# Patient Record
Sex: Female | Born: 1945 | Race: White | Hispanic: No | Marital: Married | Smoking: Never smoker
Health system: Southern US, Community
[De-identification: ages and names within clinical notes are randomized; demographics above are authoritative.]

## PROBLEM LIST (undated history)

## (undated) DIAGNOSIS — L509 Urticaria, unspecified: Secondary | ICD-10-CM

## (undated) DIAGNOSIS — I10 Essential (primary) hypertension: Secondary | ICD-10-CM

## (undated) HISTORY — PX: REPLACEMENT TOTAL KNEE: SUR1224

## (undated) HISTORY — PX: KNEE ARTHROSCOPY W/ MENISCAL REPAIR: SHX1877

## (undated) HISTORY — PX: FOOT SURGERY: SHX648

## (undated) HISTORY — DX: Urticaria, unspecified: L50.9

## (undated) HISTORY — PX: OTHER SURGICAL HISTORY: SHX169

## (undated) NOTE — Progress Notes (Signed)
 Formatting of this note is different from the original.   Subjective:  Subjective Patient ID: Laurie Barker is a 82 y.o. female.  Laurie Barker is here this evening with c/o sore throat and persistent cough Is [place] and developed symptoms 2 days ago Denies fever but has felt chilled Decreased appetite and energy Would like strep testing done No known exposure Has not taken anything for current symptoms Cough keeping her up at night  Patient's problem list, medications, allergies, past medical, surgical, social and family histories were reviewed and updated as appropriate.  Review of Systems  Constitutional: Positive for appetite change, chills and fatigue. Negative for fever.  HENT: Positive for sore throat. Negative for congestion.   Respiratory: Positive for cough. Negative for chest tightness and shortness of breath.     Objective:  Objective BP 112/80   Pulse 65   Temp 36.1 C (96.9 F) (Temporal)   Resp 16   Ht 154.9 cm (5' 1)   Wt 77.8 kg (171 lb 9.6 oz)   SpO2 96%   BMI 32.42 kg/m  Body mass index is 32.42 kg/m.  Physical Exam Vitals and nursing note reviewed.  Constitutional:      General: She is not in acute distress.    Appearance: She is well-developed. She is ill-appearing. She is not toxic-appearing or diaphoretic.  HENT:     Mouth/Throat:     Mouth: Mucous membranes are moist.     Pharynx: Uvula midline. Pharyngeal swelling and posterior oropharyngeal erythema present. No oropharyngeal exudate.     Tonsils: No tonsillar abscesses.  Cardiovascular:     Rate and Rhythm: Normal rate.     Heart sounds: Normal heart sounds.  Pulmonary:     Effort: Pulmonary effort is normal. No respiratory distress.     Breath sounds: Normal breath sounds.  Lymphadenopathy:     Cervical: Cervical adenopathy present.  Skin:    General: Skin is warm and dry.  Neurological:     General: No focal deficit present.     Mental Status: She is alert.  Psychiatric:         Mood and Affect: Mood normal.        Behavior: Behavior normal.     Assessment/Orders:  Assessment Diagnoses and all orders for this visit:  Strep pharyngitis -     Discontinue: azithromycin (ZITHROMAX) 500 MG tablet; Take 1 (one) tablet by mouth daily for 5 days. -     Discontinue: predniSONE (DELTASONE) 20 MG tablet; Take 2 (two) tablets by mouth daily for 5 days. -     azithromycin (ZITHROMAX) 500 MG tablet; Take 1 (one) tablet by mouth daily for 5 days. -     predniSONE (DELTASONE) 20 MG tablet; Take 2 (two) tablets by mouth daily for 5 days.  Sore throat -     AMB POCT Rapid Strep A  Acute bronchitis, unspecified organism    Results for orders placed or performed in visit on 03/26/22 (from the past 24 hour(s))  AMB POCT Rapid Strep A   Collection Time: 03/26/22 12:00 AM  Result Value Ref Range   Rapid Strep A Screen Positive (A) Negative   No images are attached to the encounter or orders placed in the encounter.  Plan:  Plan   Advised to return to clinic or primary care clinic if problems persist or worsen.    Patient instructed to take the full length of prescribed medications, to not share any foods/utensils/drinks with anyone else, to throw  away current toothbrush and use a new one, good hand hygiene, gargle with salt water (mix 1/2 tsp salt with 8 oz warm water gargles every 6 hours as needed to soothe throat), drink plenty of liquids, eat soft/bland foods/popsicles until other foods are tolerable, only return to work/daycare/school after 24 hours of antibiotic use, and get plenty of rest.   Electronically signed by Franchot Shellia LABOR, ARNP at 03/26/2022  5:41 PM CDT

---

## 2017-07-01 ENCOUNTER — Encounter (HOSPITAL_BASED_OUTPATIENT_CLINIC_OR_DEPARTMENT_OTHER): Payer: Self-pay

## 2017-07-01 ENCOUNTER — Emergency Department (HOSPITAL_BASED_OUTPATIENT_CLINIC_OR_DEPARTMENT_OTHER): Payer: Medicare Other

## 2017-07-01 ENCOUNTER — Emergency Department (HOSPITAL_BASED_OUTPATIENT_CLINIC_OR_DEPARTMENT_OTHER)
Admission: EM | Admit: 2017-07-01 | Discharge: 2017-07-01 | Disposition: A | Discharge status: Home / Self Care | Payer: Medicare Other | Attending: Emergency Medicine | Admitting: Emergency Medicine

## 2017-07-01 ENCOUNTER — Other Ambulatory Visit: Payer: Self-pay

## 2017-07-01 DIAGNOSIS — Y999 Unspecified external cause status: Secondary | ICD-10-CM | POA: Diagnosis not present

## 2017-07-01 DIAGNOSIS — Y929 Unspecified place or not applicable: Secondary | ICD-10-CM | POA: Diagnosis not present

## 2017-07-01 DIAGNOSIS — I10 Essential (primary) hypertension: Secondary | ICD-10-CM | POA: Insufficient documentation

## 2017-07-01 DIAGNOSIS — S59911A Unspecified injury of right forearm, initial encounter: Secondary | ICD-10-CM | POA: Diagnosis present

## 2017-07-01 DIAGNOSIS — Z79899 Other long term (current) drug therapy: Secondary | ICD-10-CM | POA: Diagnosis not present

## 2017-07-01 DIAGNOSIS — T148XXA Other injury of unspecified body region, initial encounter: Secondary | ICD-10-CM

## 2017-07-01 DIAGNOSIS — Z91040 Latex allergy status: Secondary | ICD-10-CM | POA: Diagnosis not present

## 2017-07-01 DIAGNOSIS — Y939 Activity, unspecified: Secondary | ICD-10-CM | POA: Diagnosis not present

## 2017-07-01 DIAGNOSIS — X58XXXA Exposure to other specified factors, initial encounter: Secondary | ICD-10-CM | POA: Diagnosis not present

## 2017-07-01 DIAGNOSIS — S46911A Strain of unspecified muscle, fascia and tendon at shoulder and upper arm level, right arm, initial encounter: Secondary | ICD-10-CM | POA: Insufficient documentation

## 2017-07-01 HISTORY — DX: Essential (primary) hypertension: I10

## 2017-07-01 MED ORDER — IBUPROFEN 800 MG PO TABS
ORAL_TABLET | ORAL | Status: AC
  Filled 2017-07-01: qty 1

## 2017-07-01 MED ORDER — IBUPROFEN 800 MG PO TABS
800.0000 mg | ORAL_TABLET | Freq: Once | ORAL | Status: AC
  Administered 2017-07-01: 800 mg via ORAL
  Filled 2017-07-01: qty 1

## 2017-07-01 MED ORDER — IBUPROFEN 400 MG PO TABS: 400.0000 mg | ORAL_TABLET | Freq: Three times a day (TID) | ORAL | 0 refills | Status: DC | PRN

## 2017-07-01 MED ORDER — ACETAMINOPHEN 500 MG PO TABS
1000.0000 mg | ORAL_TABLET | Freq: Once | ORAL | Status: AC
  Administered 2017-07-01: 1000 mg via ORAL
  Filled 2017-07-01: qty 2
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
## 2017-07-03 ENCOUNTER — Encounter (HOSPITAL_BASED_OUTPATIENT_CLINIC_OR_DEPARTMENT_OTHER): Payer: Self-pay | Admitting: Emergency Medicine

## 2017-08-05 ENCOUNTER — Ambulatory Visit (INDEPENDENT_AMBULATORY_CARE_PROVIDER_SITE_OTHER): Payer: Medicare Other | Admitting: Allergy and Immunology

## 2017-08-05 ENCOUNTER — Encounter: Payer: Self-pay | Admitting: Allergy and Immunology

## 2017-08-05 VITALS — BP 140/90 | HR 70 | Temp 97.9°F | Resp 16 | Ht 60.5 in | Wt 169.6 lb

## 2017-08-05 DIAGNOSIS — K2970 Gastritis, unspecified, without bleeding: Secondary | ICD-10-CM

## 2017-08-05 DIAGNOSIS — L500 Allergic urticaria: Secondary | ICD-10-CM | POA: Diagnosis not present

## 2017-08-05 DIAGNOSIS — T7840XD Allergy, unspecified, subsequent encounter: Secondary | ICD-10-CM

## 2017-08-05 DIAGNOSIS — T7840XA Allergy, unspecified, initial encounter: Secondary | ICD-10-CM | POA: Insufficient documentation

## 2017-08-05 MED ORDER — RANITIDINE HCL 150 MG PO TABS: 150.0000 mg | ORAL_TABLET | Freq: Two times a day (BID) | ORAL | 5 refills | Status: AC

## 2017-08-05 MED ORDER — FEXOFENADINE HCL 180 MG PO TABS: 180.0000 mg | ORAL_TABLET | Freq: Every day | ORAL | 5 refills | Status: AC | PRN

## 2017-08-05 NOTE — Patient Instructions (Addendum)
Recurrent urticaria Unclear etiology.  The history suggests that the onset of the mast cell release was triggered by a viral or bacterial infection.  However, the persistence of the urticaria over the past 7 or 8 months warrants further investigation.  Skin tests to select food allergens were negative today. NSAIDs and emotional stress commonly exacerbate urticaria but are not the underlying etiology in this case. Physical urticarias are negative by history (i.e. pressure-induced, temperature, vibration, solar, etc.). There are no concomitant symptoms concerning for anaphylaxis. We will rule out other potential etiologies with labs. For symptom relief, patient is to take oral antihistamines as directed.  The following labs have been ordered: FCeRI antibody, TSH, anti-thyroglobulin antibody, thyroid peroxidase antibody, tryptase, urea breath test, CBC, CMP, ESR, ANA, and galactose-alpha-1,3-galactose IgE level.  The patient will be called with further recommendations after lab results have returned.  Instructions have been discussed and provided for H1/H2 receptor blockade with titration to find lowest effective dose.  A prescription has been provided for fexofenadine 180 mg daily as needed.  A prescription has been provided for ranitidine 150 mg twice daily.  Should there be a significant increase or change in symptoms, a journal is to be kept recording any foods eaten, beverages consumed, medications taken within a 6 hour period prior to the onset of symptoms, as well as record activities being performed, and environmental conditions. For any symptoms concerning for anaphylaxis, 911 is to be called immediately.   When lab results have returned the patient will be called with further recommendations and follow up instructions.   Urticaria (Hives)  . Fexofenadine (Allegra) 180 mg once a day.  If symptoms continue then increase to .  Marland Kitchen Fexofenadine (Allegra) 180 mg  twice a day.  If symptoms  continue then increase to .  Marland Kitchen Fexofenadine (Allegra) 180 mg  twice a day and Ranitidine (Zantac) 150 mg once a day.  If symptoms continue then increase to.  Marland Kitchen Fexofenadine (Allegra) 180 mg  twice a day and Ranitidine (Zantac) 150 mg twice a day  May use hydroxyzine or Benadryl as needed for breakthrough symptoms       If no symptoms for 7 days, then step down dosage

## 2017-08-05 NOTE — Progress Notes (Signed)
New Patient Note  RE: Laurie Barker MRN: [id number] DOB: [birth year] Date of Office Visit: 08/05/2017  Referring provider: Keane Scrape* Primary care provider: Jonathon Bellows, PA-C  Chief Complaint: Allergic Reaction and Urticaria   History of present illness: Laurie Barker is a 71 y.o. female seen today in consultation requested by Keane Scrape, PA-C.  Over the past 7 months, [name] has experienced recurrent episodes of hives.  The first episode occurred in May.  Apparently, she went to bed one night febrile with chills and woke up the next morning "covered in hives."  The hives have appeared at different times over her entire body.  The lesions are described as erythematous, raised, and pruritic.  Individual hives last less than 24 hours without leaving residual pigmentation or bruising. She denies concomitant angioedema, cardiopulmonary symptoms and GI symptoms. She has not experienced unexpected weight loss, recurrent fevers or drenching night sweats. No specific medication, food, skin care product, detergent, soap, or other environmental triggers have been identified.  She started losartan several months ago, however this medication was initiated after the onset of urticaria.  The symptoms do not seem to correlate with NSAIDs use or emotional stress. Darah attempts to control the hives with topical betamethasone valerate and hydroxyzine at bedtime with inadequate relief.  She has received 2 corticosteroid injections with temporary suppression of hives.  She has not been evaluated and treated in the emergency department for these symptoms. She has been evaluated by a dermatologist and skin biopsy has been performed, though with inconclusive results. Recently, she has been experiencing hives daily.   Assessment and plan: Recurrent urticaria Unclear etiology.  The history suggests that the onset of the mast cell release was triggered by a viral or  bacterial infection.  However, the persistence of the urticaria over the past 7 or 8 months warrants further investigation.  Skin tests to select food allergens were negative today. NSAIDs and emotional stress commonly exacerbate urticaria but are not the underlying etiology in this case. Physical urticarias are negative by history (i.e. pressure-induced, temperature, vibration, solar, etc.). There are no concomitant symptoms concerning for anaphylaxis. We will rule out other potential etiologies with labs. For symptom relief, patient is to take oral antihistamines as directed.  The following labs have been ordered: FCeRI antibody, TSH, anti-thyroglobulin antibody, thyroid peroxidase antibody, tryptase, urea breath test, CBC, CMP, ESR, ANA, and galactose-alpha-1,3-galactose IgE level.  The patient will be called with further recommendations after lab results have returned.  Instructions have been discussed and provided for H1/H2 receptor blockade with titration to find lowest effective dose.  A prescription has been provided for fexofenadine 180 mg daily as needed.  A prescription has been provided for ranitidine 150 mg twice daily.  Should there be a significant increase or change in symptoms, a journal is to be kept recording any foods eaten, beverages consumed, medications taken within a 6 hour period prior to the onset of symptoms, as well as record activities being performed, and environmental conditions. For any symptoms concerning for anaphylaxis, 911 is to be called immediately.   Meds ordered this encounter  Medications  . ranitidine (ZANTAC) 150 MG tablet    Sig: Take 1 tablet (150 mg total) by mouth 2 (two) times daily.    Dispense:  60 tablet    Refill:  5  . fexofenadine (ALLEGRA) 180 MG tablet    Sig: Take 1 tablet (180 mg total) by mouth daily as needed for allergies or rhinitis.  Dispense:  30 tablet    Refill:  5    Diagnostics: Environmental skin testing: Negative  despite a positive histamine control. Food allergen skin testing: Negative despite a positive histamine control.    Physical examination: Blood pressure 140/90, pulse 70, temperature 97.9 F (36.6 C), temperature source Oral, resp. rate 16, height 5' 0.5" (1.537 m), weight 169 lb 9.6 oz (76.9 kg), SpO2 97 %.  General: Alert, interactive, in no acute distress. HEENT: TMs pearly gray, turbinates mildly edematous without discharge, post-pharynx unremarkable. Neck: Supple without lymphadenopathy. Lungs: Clear to auscultation without wheezing, rhonchi or rales. CV: Normal S1, S2 without murmurs. Abdomen: Nondistended, nontender. Skin: Scattered erythematous urticarial type lesions primarily located upper extremities and lower back , nonvesicular. Extremities:  No clubbing, cyanosis or edema. Neuro:   Grossly intact.  Review of systems:  Review of systems negative except as noted in HPI / PMHx or noted below: Review of Systems  Constitutional: Negative.   HENT: Negative.   Eyes: Negative.   Respiratory: Negative.   Cardiovascular: Negative.   Gastrointestinal: Negative.   Genitourinary: Negative.   Musculoskeletal: Negative.   Skin: Negative.   Neurological: Negative.   Endo/Heme/Allergies: Negative.   Psychiatric/Behavioral: Negative.     Past medical history:  Past Medical History:  Diagnosis Date  . Hypertension   . Urticaria     Past surgical history:  Past Surgical History:  Procedure Laterality Date  . arm surgery    . CESAREAN SECTION    . FOOT SURGERY    . KNEE ARTHROSCOPY W/ MENISCAL REPAIR    . REPLACEMENT TOTAL KNEE      Family history: Family History  Problem Relation Age of Onset  . Allergic rhinitis Neg Hx   . Angioedema Neg Hx   . Asthma Neg Hx   . Eczema Neg Hx   . Immunodeficiency Neg Hx   . Urticaria Neg Hx     Social history: Social History   Socioeconomic History  . Marital status: Married    Spouse name: Not on file  . Number of  children: Not on file  . Years of education: Not on file  . Highest education level: Not on file  Social Needs  . Financial resource strain: Not on file  . Food insecurity - worry: Not on file  . Food insecurity - inability: Not on file  . Transportation needs - medical: Not on file  . Transportation needs - non-medical: Not on file  Occupational History  . Not on file  Tobacco Use  . Smoking status: Never Smoker  . Smokeless tobacco: Never Used  Substance and Sexual Activity  . Alcohol use: Yes    Frequency: Never    Comment: daily  . Drug use: No  . Sexual activity: Not on file  Other Topics Concern  . Not on file  Social History Narrative  . Not on file   Environmental History: The patient lives in a 69 year old house with carpeting throughout and central air/heat.  There is a dog in the house.  There is no known mold/water damage in the home.  She is a non-smoker.  Allergies as of 08/05/2017      Reactions   Benzocaine    Codeine    Demerol [meperidine]    Gold-containing Drug Products    Latex    Morphine And Related    Sulfa Antibiotics       Medication List        Accurate as of 08/05/17 12:52  PM. Always use your most recent med list.          ADVIL PM 200-25 MG Caps Generic drug:  Ibuprofen-Diphenhydramine HCl Take by mouth.   betamethasone valerate 0.1 % cream Commonly known as:  VALISONE Apply 1 application topically as needed.   fexofenadine 180 MG tablet Commonly known as:  ALLEGRA Take 1 tablet (180 mg total) by mouth daily as needed for allergies or rhinitis.   hydrOXYzine 25 MG tablet Commonly known as:  ATARAX/VISTARIL Take 25 mg by mouth daily.   ibuprofen 400 MG tablet Commonly known as:  ADVIL,MOTRIN Take 1 tablet (400 mg total) every 8 (eight) hours as needed by mouth for moderate pain.   JOINT HEALTH PO Take by mouth.   LOSARTAN POTASSIUM PO Take by mouth.   Melatonin 3 MG Tabs Take by mouth.   Potassium 99 MG Tabs Take  by mouth.   PROBIOTIC-10 Chew Chew by mouth.   ranitidine 150 MG tablet Commonly known as:  ZANTAC Take 1 tablet (150 mg total) by mouth 2 (two) times daily.   triamcinolone cream 0.1 % Commonly known as:  KENALOG Apply 1 application topically as needed.       Known medication allergies: Allergies  Allergen Reactions  . Benzocaine   . Codeine   . Demerol [Meperidine]   . Gold-Containing Drug Products   . Latex   . Morphine And Related   . Sulfa Antibiotics     I appreciate the opportunity to take part in Neiva's care. Please do not hesitate to contact me with questions.  Sincerely,   R. Edgar Frisk, MD

## 2017-08-05 NOTE — Assessment & Plan Note (Signed)
Unclear etiology.  The history suggests that the onset of the mast cell release was triggered by a viral or bacterial infection.  However, the persistence of the urticaria over the past 7 or 8 months warrants further investigation.  Skin tests to select food allergens were negative today. NSAIDs and emotional stress commonly exacerbate urticaria but are not the underlying etiology in this case. Physical urticarias are negative by history (i.e. pressure-induced, temperature, vibration, solar, etc.). There are no concomitant symptoms concerning for anaphylaxis. We will rule out other potential etiologies with labs. For symptom relief, patient is to take oral antihistamines as directed.  The following labs have been ordered: FCeRI antibody, TSH, anti-thyroglobulin antibody, thyroid peroxidase antibody, tryptase, urea breath test, CBC, CMP, ESR, ANA, and galactose-alpha-1,3-galactose IgE level.  The patient will be called with further recommendations after lab results have returned.  Instructions have been discussed and provided for H1/H2 receptor blockade with titration to find lowest effective dose.  A prescription has been provided for fexofenadine 180 mg daily as needed.  A prescription has been provided for ranitidine 150 mg twice daily.  Should there be a significant increase or change in symptoms, a journal is to be kept recording any foods eaten, beverages consumed, medications taken within a 6 hour period prior to the onset of symptoms, as well as record activities being performed, and environmental conditions. For any symptoms concerning for anaphylaxis, 911 is to be called immediately.

## 2017-08-06 LAB — H. PYLORI BREATH TEST: H pylori Breath Test: NEGATIVE

## 2017-08-20 LAB — COMPREHENSIVE METABOLIC PANEL
ALT: 24 IU/L (ref 0–32)
AST: 23 IU/L (ref 0–40)
Albumin/Globulin Ratio: 2.3 — ABNORMAL HIGH (ref 1.2–2.2)
Albumin: 4.3 g/dL (ref 3.6–4.8)
Alkaline Phosphatase: 48 IU/L (ref 39–117)
BUN/Creatinine Ratio: 24 (ref 12–28)
BUN: 18 mg/dL (ref 8–27)
Bilirubin Total: 0.4 mg/dL (ref 0.0–1.2)
CO2: 28 mmol/L (ref 20–29)
Calcium: 9.3 mg/dL (ref 8.7–10.3)
Chloride: 102 mmol/L (ref 96–106)
Creatinine, Ser: 0.75 mg/dL (ref 0.57–1.00)
GFR calc Af Amer: 94 mL/min/{1.73_m2} (ref >59)
GFR calc non Af Amer: 82 mL/min/{1.73_m2} (ref >59)
Globulin, Total: 1.9 g/dL (ref 1.5–4.5)
Glucose: 87 mg/dL (ref 65–99)
Potassium: 4.2 mmol/L (ref 3.5–5.2)
Sodium: 141 mmol/L (ref 134–144)
Total Protein: 6.2 g/dL (ref 6.0–8.5)

## 2017-08-20 LAB — ALPHA-GAL PANEL
Alpha Gal IgE*: <0.1 kU/L (ref <0.10)
Beef (Bos spp) IgE: <0.1 kU/L (ref <0.35)
Class Interpretation: 0
Class Interpretation: 0
Class Interpretation: 0
Lamb/Mutton (Ovis spp) IgE: <0.1 kU/L (ref <0.35)
Pork (Sus spp) IgE: <0.1 kU/L (ref <0.35)

## 2017-08-20 LAB — CBC WITH DIFFERENTIAL/PLATELET
Basophils Absolute: 0 10*3/uL (ref 0.0–0.2)
Basos: 1 %
EOS (ABSOLUTE): 0.2 10*3/uL (ref 0.0–0.4)
Eos: 3 %
Hematocrit: 38.8 % (ref 34.0–46.6)
Hemoglobin: 13.1 g/dL (ref 11.1–15.9)
Immature Grans (Abs): 0 10*3/uL (ref 0.0–0.1)
Immature Granulocytes: 0 %
Lymphocytes Absolute: 1.8 10*3/uL (ref 0.7–3.1)
Lymphs: 26 %
MCH: 31.9 pg (ref 26.6–33.0)
MCHC: 33.8 g/dL (ref 31.5–35.7)
MCV: 94 fL (ref 79–97)
Monocytes Absolute: 0.4 10*3/uL (ref 0.1–0.9)
Monocytes: 6 %
Neutrophils Absolute: 4.4 10*3/uL (ref 1.4–7.0)
Neutrophils: 64 %
Platelets: 284 10*3/uL (ref 150–379)
RBC: 4.11 x10E6/uL (ref 3.77–5.28)
RDW: 13.5 % (ref 12.3–15.4)
WBC: 6.8 10*3/uL (ref 3.4–10.8)

## 2017-08-20 LAB — THYROID PEROXIDASE ANTIBODY: Thyroperoxidase Ab SerPl-aCnc: 11 IU/mL (ref 0–34)

## 2017-08-20 LAB — ANA W/REFLEX IF POSITIVE: Anti Nuclear Antibody(ANA): NEGATIVE

## 2017-08-20 LAB — THYROGLOBULIN LEVEL: Thyroglobulin (TG-RIA): 24 ng/mL

## 2017-08-20 LAB — TRYPTASE: Tryptase: 8.4 ug/L (ref 2.2–13.2)

## 2017-08-20 LAB — CHRONIC URTICARIA: cu index: 2.5 (ref <10)

## 2017-08-20 LAB — SEDIMENTATION RATE: Sed Rate: 8 mm/hr (ref 0–40)

## 2017-08-26 ENCOUNTER — Telehealth: Payer: Self-pay

## 2017-08-26 MED ORDER — MONTELUKAST SODIUM 10 MG PO TABS: 10.0000 mg | ORAL_TABLET | Freq: Every day | ORAL | 5 refills | Status: AC

## 2017-08-26 NOTE — Telephone Encounter (Signed)
Left a detailed message informing her that we are sending in a new medication to take along with her other medications that was prescribed.

## 2017-08-26 NOTE — Telephone Encounter (Signed)
Please send in a prescription for montelukast 10 mg daily. If the urticaria persists or progresses, despite using fexofenadine twice a day, ranitidine twice a day, and montelukast daily, we will consider Xolair injections.

## 2017-08-26 NOTE — Telephone Encounter (Signed)
Spoke to patient about her lab results. I informed her to continue taking her Allegra and Zantac. She stated that she breaking out in a rash and she is still itching. She would like to know is there anything else that can be called in for her. Please advise

## 2017-12-08 ENCOUNTER — Other Ambulatory Visit: Payer: Self-pay

## 2017-12-08 ENCOUNTER — Emergency Department (HOSPITAL_BASED_OUTPATIENT_CLINIC_OR_DEPARTMENT_OTHER): Payer: Medicare Other

## 2017-12-08 ENCOUNTER — Encounter (HOSPITAL_BASED_OUTPATIENT_CLINIC_OR_DEPARTMENT_OTHER): Payer: Self-pay

## 2017-12-08 ENCOUNTER — Emergency Department (HOSPITAL_BASED_OUTPATIENT_CLINIC_OR_DEPARTMENT_OTHER)
Admission: EM | Admit: 2017-12-08 | Discharge: 2017-12-08 | Disposition: A | Discharge status: Home / Self Care | Payer: Medicare Other | Attending: Emergency Medicine | Admitting: Emergency Medicine

## 2017-12-08 DIAGNOSIS — Y9301 Activity, walking, marching and hiking: Secondary | ICD-10-CM | POA: Insufficient documentation

## 2017-12-08 DIAGNOSIS — I10 Essential (primary) hypertension: Secondary | ICD-10-CM | POA: Diagnosis not present

## 2017-12-08 DIAGNOSIS — Y998 Other external cause status: Secondary | ICD-10-CM | POA: Diagnosis not present

## 2017-12-08 DIAGNOSIS — Z96659 Presence of unspecified artificial knee joint: Secondary | ICD-10-CM | POA: Diagnosis not present

## 2017-12-08 DIAGNOSIS — S59911A Unspecified injury of right forearm, initial encounter: Secondary | ICD-10-CM | POA: Diagnosis present

## 2017-12-08 DIAGNOSIS — Y929 Unspecified place or not applicable: Secondary | ICD-10-CM | POA: Diagnosis not present

## 2017-12-08 DIAGNOSIS — Z91040 Latex allergy status: Secondary | ICD-10-CM | POA: Diagnosis not present

## 2017-12-08 DIAGNOSIS — W19XXXA Unspecified fall, initial encounter: Secondary | ICD-10-CM

## 2017-12-08 DIAGNOSIS — Z79899 Other long term (current) drug therapy: Secondary | ICD-10-CM | POA: Diagnosis not present

## 2017-12-08 DIAGNOSIS — S52041A Displaced fracture of coronoid process of right ulna, initial encounter for closed fracture: Secondary | ICD-10-CM | POA: Diagnosis not present

## 2017-12-08 DIAGNOSIS — W108XXA Fall (on) (from) other stairs and steps, initial encounter: Secondary | ICD-10-CM | POA: Insufficient documentation

## 2017-12-08 MED ORDER — HYDROCODONE-ACETAMINOPHEN 5-325 MG PO TABS: 1.0000 | ORAL_TABLET | Freq: Four times a day (QID) | ORAL | 0 refills | Status: AC | PRN

## 2017-12-08 MED FILL — HYDROCODON-APAP 5-325: 5-325 | 2 days supply | Qty: 10 | Fill #0

## 2017-12-08 NOTE — Progress Notes (Signed)
Discussed case and reviewed x-rays with Dr. Herma CarsonZ.   Recommended splinting and follow up in my office with me or Dr. Everardo PacificVarkey in 1-2 weeks.    Eulas PostJoshua P Craven Crean, MD

## 2017-12-08 NOTE — ED Triage Notes (Signed)
Pt reports fall yesterday outside, states she missed a step. Pt reports right elbow pain, previous surgical site. Pt denies hitting head, or LOC.

## 2017-12-08 NOTE — ED Notes (Signed)
ED Provider at bedside. 

## 2017-12-08 NOTE — ED Provider Notes (Signed)
[facility] EMERGENCY DEPARTMENT Provider Note   CSN: [id number] Arrival date & time: 12/08/17  1027     History   Chief Complaint Chief Complaint  Patient presents with  . Fall    HPI Laurie Barker is a 72 y.o. female.  Patient with a fall yesterday outside she missed a step and went down on her right elbow.  Has pain to right elbow did not hit her head.  No loss of consciousness is not on blood thinners.  No hip pain.  Pain is just to the right elbow area and she has decreased range of motion.  Previously patient had a resection of her radial head due to her fracture several years ago.  Patient recently moved from [place] and does not have orthopedic follow-up here.     Past Medical History:  Diagnosis Date  . Hypertension   . Urticaria     Patient Active Problem List   Diagnosis Date Noted  . Allergic reaction 08/05/2017  . Recurrent urticaria 08/05/2017    Past Surgical History:  Procedure Laterality Date  . arm surgery    . CESAREAN SECTION    . FOOT SURGERY    . KNEE ARTHROSCOPY W/ MENISCAL REPAIR    . REPLACEMENT TOTAL KNEE       OB History   None      Home Medications    Prior to Admission medications   Medication Sig Start Date End Date Taking? Authorizing Provider  betamethasone valerate (VALISONE) 0.1 % cream Apply 1 application topically as needed. 01/20/17 01/20/18  [provider]  fexofenadine (ALLEGRA) 180 MG tablet Take 1 tablet (180 mg total) by mouth daily as needed for allergies or rhinitis. 08/05/17   Bobbitt, Heywood Iles, MD  Glucosamine-MSM-Hyaluronic Acd (JOINT HEALTH PO) Take by mouth.    [provider]  HYDROcodone-acetaminophen (NORCO/VICODIN) 5-325 MG tablet Take 1-2 tablets by mouth every 6 (six) hours as needed for moderate pain. 12/08/17   Vanetta Mulders, MD  hydrOXYzine (ATARAX/VISTARIL) 25 MG tablet Take 25 mg by mouth daily. 04/20/17   [provider]  ibuprofen (ADVIL,MOTRIN) 400 MG  tablet Take 1 tablet (400 mg total) every 8 (eight) hours as needed by mouth for moderate pain. 07/01/17   Palumbo, April, MD  Ibuprofen-Diphenhydramine HCl (ADVIL PM) 200-25 MG CAPS Take by mouth.    [provider]  LOSARTAN POTASSIUM PO Take by mouth.    [provider]  Melatonin 3 MG TABS Take by mouth.    [provider]  montelukast (SINGULAIR) 10 MG tablet Take 1 tablet (10 mg total) by mouth at bedtime. 08/26/17   Bobbitt, Heywood Iles, MD  Potassium 99 MG TABS Take by mouth.    [provider]  Probiotic Product (PROBIOTIC-10) CHEW Chew by mouth.    [provider]  ranitidine (ZANTAC) 150 MG tablet Take 1 tablet (150 mg total) by mouth 2 (two) times daily. 08/05/17   Bobbitt, Heywood Iles, MD  triamcinolone cream (KENALOG) 0.1 % Apply 1 application topically as needed. 07/20/17   [provider]    Family History Family History  Problem Relation Age of Onset  . Allergic rhinitis Neg Hx   . Angioedema Neg Hx   . Asthma Neg Hx   . Eczema Neg Hx   . Immunodeficiency Neg Hx   . Urticaria Neg Hx     Social History Social History   Tobacco Use  . Smoking status: Never Smoker  . Smokeless tobacco: Never  Used  Substance Use Topics  . Alcohol use: Yes    Frequency: Never    Comment: daily  . Drug use: No     Allergies   Benzocaine; Codeine; Demerol [meperidine]; Gold-containing drug products; Latex; Morphine and related; and Sulfa antibiotics   Review of Systems Review of Systems  Constitutional: Negative for fever.  HENT: Negative for congestion.   Eyes: Negative for visual disturbance.  Respiratory: Negative for shortness of breath.   Cardiovascular: Negative for chest pain.  Gastrointestinal: Negative for abdominal pain, nausea and vomiting.  Genitourinary: Negative for dysuria and hematuria.  Musculoskeletal: Positive for joint swelling. Negative for back pain and neck pain.  Skin: Negative for wound.    Neurological: Negative for headaches.  Hematological: Does not bruise/bleed easily.  Psychiatric/Behavioral: Negative for confusion.     Physical Exam Updated Vital Signs BP (!) 112/98 (BP Location: Left Arm)   Pulse 69   Temp 97.8 F (36.6 C) (Oral)   Resp 17   Ht 1.549 m (5\' 1" )   Wt 75.3 kg (166 lb)   SpO2 100%   BMI 31.37 kg/m   Physical Exam  Constitutional: She is oriented to person, place, and time. She appears well-developed and well-nourished. No distress.  HENT:  Head: Normocephalic and atraumatic.  Mouth/Throat: Oropharynx is clear and moist.  Eyes: Pupils are equal, round, and reactive to light. Conjunctivae and EOM are normal.  Neck: Normal range of motion. Neck supple.  Cardiovascular: Normal rate, regular rhythm and normal heart sounds.  Pulmonary/Chest: Effort normal and breath sounds normal. No respiratory distress.  Abdominal: Soft. Bowel sounds are normal. There is no tenderness.  Musculoskeletal: She exhibits tenderness. She exhibits no deformity.  No significant extremity injuries other than concern for right elbow.  Right wrist right shoulder with good range of motion radial pulse 2+.  Good cap refill sensation intact.  Patient with decreased range of motion of the right elbow some swelling to the right elbow tender to palpation particularly anteriorly.  Patient has really very limited supination pronation.  Elbow extension limited to about 45 degrees.  Flexion is limited to about 85 degrees.  Neurological: She is alert and oriented to person, place, and time. No cranial nerve deficit or sensory deficit. She exhibits normal muscle tone. Coordination normal.  Skin: Skin is warm.  Nursing note and vitals reviewed.    ED Treatments / Results  Labs (all labs ordered are listed, but only abnormal results are displayed) Labs Reviewed - No data to display  EKG None  Radiology Dg Elbow Complete Right  Result Date: 12/08/2017 CLINICAL DATA:  Larey SeatFell down  steps yesterday landing on arm/elbow, pain, unable to externally rotate, remote radial head injury 5 years ago EXAM: RIGHT ELBOW - COMPLETE 3+ VIEW COMPARISON:  None FINDINGS: Diffuse osseous demineralization. Prior resection of the RIGHT radial head. Elbow joint effusion present. Distal humerus appears intact with degenerative changes at the elbow joint. Minimally displaced coronoid process fracture of the ulna. No additional fracture, dislocation or bone destruction. IMPRESSION: Minimally displaced coronoid process fracture of the proximal RIGHT ulna with associated elbow joint effusion. Degenerative changes of the RIGHT elbow joint with prior resection of the RIGHT radial head. Electronically Signed   By: Ulyses SouthwardMark  Boles M.D.   On: 12/08/2017 11:31    Procedures Procedures (including critical care time)  Medications Ordered in ED Medications - No data to display   Initial Impression / Assessment and Plan / ED Course  I have reviewed the triage  vital signs and the nursing notes.  Pertinent labs & imaging results that were available during my care of the patient were reviewed by me and considered in my medical decision making (see chart for details).     Discussed with Dr. Dion Saucier on-call for orthopedics.  They will follow patient up in the office we will place her in a long-arm splint and give her a sling.  Hydrocodone for pain.  They do feel that the fracture is most likely an acute fracture of the coronary process could be some confusion because of the prior radial head resection.  But most likely patient has an acute fracture.  Patient reexamined by myself following the splint.  Has good cap refill fingers have good range of motion patient comfortable.  Final Clinical Impressions(s) / ED Diagnoses   Final diagnoses:  Fall, initial encounter  Displaced fracture of coronoid process of right ulna, initial encounter for closed fracture    ED Discharge Orders        Ordered     HYDROcodone-acetaminophen (NORCO/VICODIN) 5-325 MG tablet  Every 6 hours PRN     12/08/17 1356       Vanetta Mulders, MD 12/08/17 1422

## 2017-12-08 NOTE — Discharge Instructions (Signed)
Keep splint in place and dry.  Use the sling to help support it.  Give Dr. Shelba FlakeLandau's group a call for follow-up next week.  Take the pain medicine as needed and as directed.  Return for any new or worse symptoms.

## 2017-12-09 ENCOUNTER — Ambulatory Visit: Payer: Medicare Other | Admitting: Family Medicine

## 2018-01-21 ENCOUNTER — Telehealth: Payer: Self-pay

## 2018-01-21 NOTE — Telephone Encounter (Signed)
Laurie Barker spoke with [facility] and they need another code for tryptase and chronic urticaria, on that [facility] paperwork we spoke about on Wednesday the assay that was listed is wrong. Medicare will not cover these (tryptase and cu) testing without a different code.

## 2018-01-24 NOTE — Telephone Encounter (Signed)
Please find out from [facility] what codes they will accept - the patient had hives and allergic reaction. Thanks.

## 2018-01-27 NOTE — Telephone Encounter (Signed)
After talking to Brittini I will have to call the pt and give her the codes and the pt will have to call medicare with those codes, for that testing. I will try to reach out to the pt today!

## 2019-07-20 IMAGING — DX DG FOREARM 2V*R*
2 series · 2 of 2 positions shown · non-contrast
Comparison: None.

CLINICAL DATA: Injury

EXAM:
RIGHT FOREARM - 2 VIEW

[forearm ap]
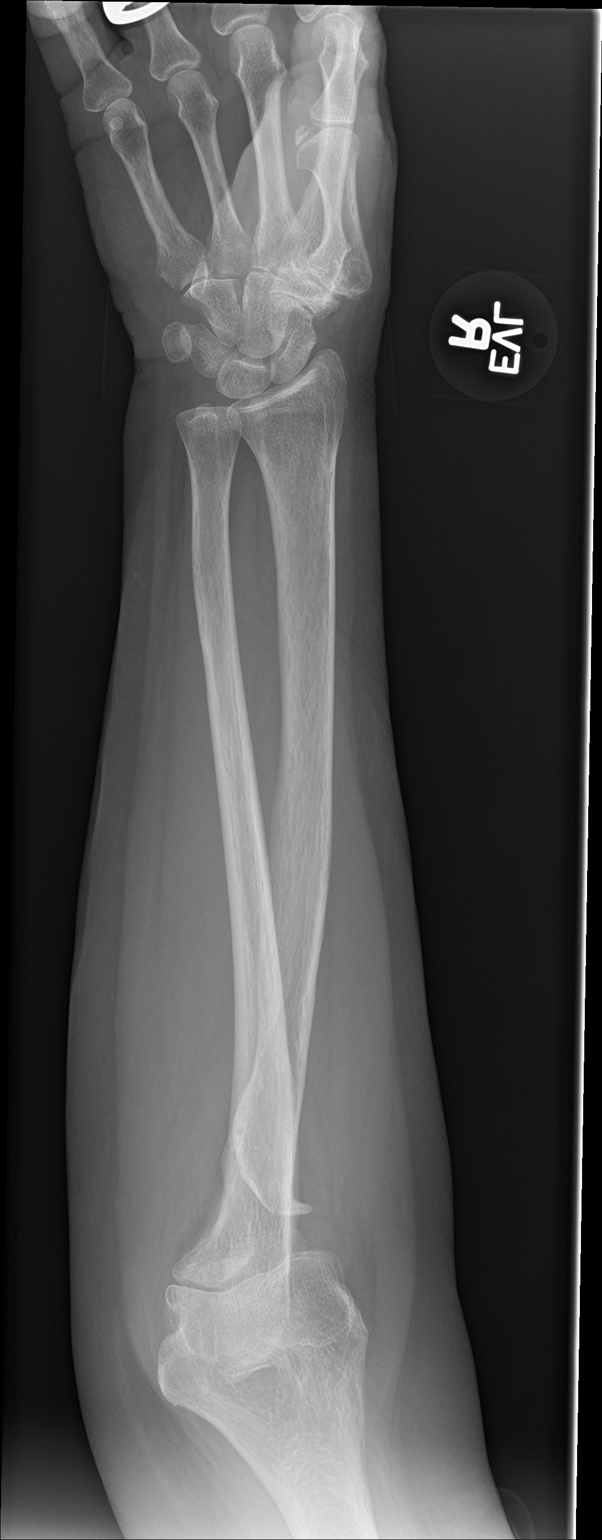

[forearm lat]
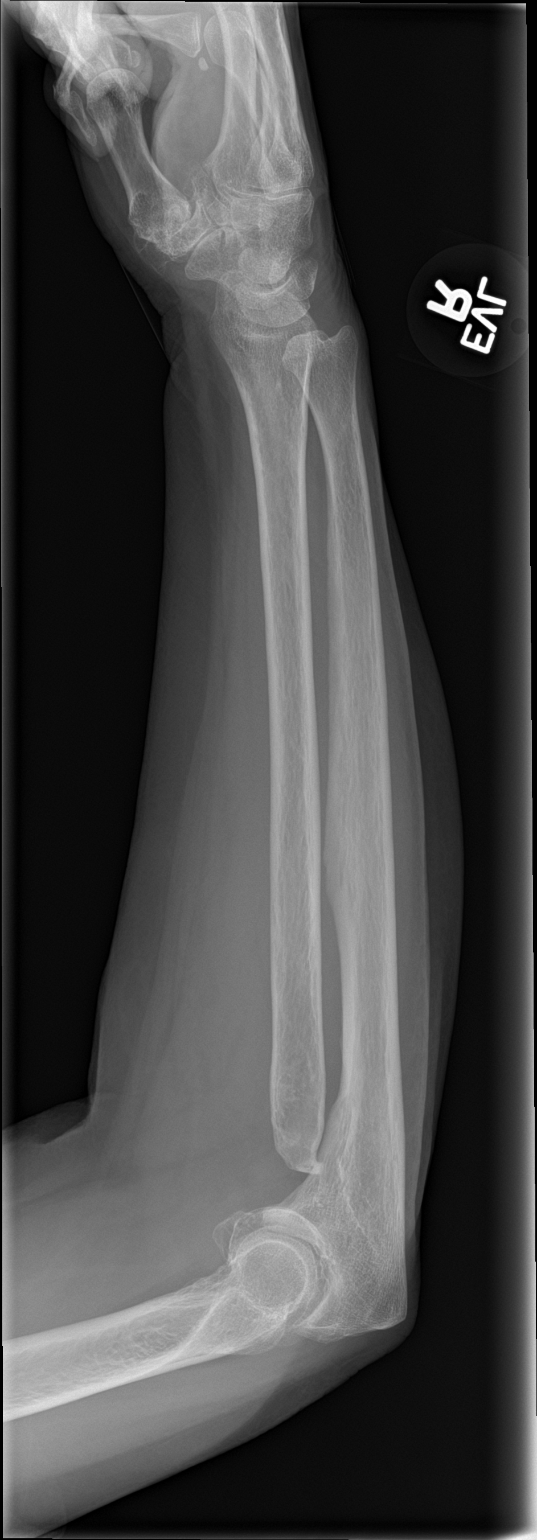

[2 of 2 positions shown; findings below may reference images not displayed]

FINDINGS: No acute displaced fracture no significant elbow effusion. Prior
resection of the radial head.
IMPRESSION: Postsurgical changes of the proximal radius. No acute fracture is
seen.

## 2020-05-30 ENCOUNTER — Emergency Department (HOSPITAL_BASED_OUTPATIENT_CLINIC_OR_DEPARTMENT_OTHER)
Admission: EM | Admit: 2020-05-30 | Discharge: 2020-05-30 | Disposition: A | Discharge status: Home / Self Care | Payer: Medicare Other | Attending: Emergency Medicine | Admitting: Emergency Medicine

## 2020-05-30 ENCOUNTER — Emergency Department (HOSPITAL_BASED_OUTPATIENT_CLINIC_OR_DEPARTMENT_OTHER): Payer: Medicare Other

## 2020-05-30 ENCOUNTER — Encounter (HOSPITAL_BASED_OUTPATIENT_CLINIC_OR_DEPARTMENT_OTHER): Payer: Self-pay | Admitting: Emergency Medicine

## 2020-05-30 ENCOUNTER — Other Ambulatory Visit: Payer: Self-pay

## 2020-05-30 DIAGNOSIS — M542 Cervicalgia: Secondary | ICD-10-CM | POA: Diagnosis not present

## 2020-05-30 DIAGNOSIS — R519 Headache, unspecified: Secondary | ICD-10-CM | POA: Diagnosis not present

## 2020-05-30 DIAGNOSIS — M25562 Pain in left knee: Secondary | ICD-10-CM | POA: Insufficient documentation

## 2020-05-30 DIAGNOSIS — Z91040 Latex allergy status: Secondary | ICD-10-CM | POA: Diagnosis not present

## 2020-05-30 DIAGNOSIS — M545 Low back pain, unspecified: Secondary | ICD-10-CM | POA: Diagnosis not present

## 2020-05-30 MED ORDER — CYCLOBENZAPRINE HCL 10 MG PO TABS: 5.0000 mg | ORAL_TABLET | Freq: Two times a day (BID) | ORAL | 0 refills | Status: AC | PRN

## 2020-05-30 MED ORDER — IBUPROFEN 800 MG PO TABS
800.0000 mg | ORAL_TABLET | Freq: Once | ORAL | Status: AC
  Administered 2020-05-30: 800 mg via ORAL
  Filled 2020-05-30: qty 1

## 2020-05-30 MED ORDER — NAPROXEN 375 MG PO TABS: 375.0000 mg | ORAL_TABLET | Freq: Two times a day (BID) | ORAL | 0 refills | Status: AC
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
## 2020-05-30 NOTE — ED Notes (Signed)
Patient transported to X-ray 
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
## 2020-05-30 NOTE — ED Notes (Signed)
ED Provider at bedside. 

## 2020-05-30 NOTE — Discharge Instructions (Signed)

## 2024-09-14 ENCOUNTER — Encounter (HOSPITAL_BASED_OUTPATIENT_CLINIC_OR_DEPARTMENT_OTHER): Admitting: Obstetrics and Gynecology

## 2024-09-15 ENCOUNTER — Encounter (HOSPITAL_BASED_OUTPATIENT_CLINIC_OR_DEPARTMENT_OTHER): Admitting: Certified Nurse Midwife

## 2024-11-14 ENCOUNTER — Encounter (HOSPITAL_BASED_OUTPATIENT_CLINIC_OR_DEPARTMENT_OTHER): Admitting: Obstetrics and Gynecology

## 2024-11-15 ENCOUNTER — Encounter (HOSPITAL_BASED_OUTPATIENT_CLINIC_OR_DEPARTMENT_OTHER): Admitting: Obstetrics and Gynecology
